# Patient Record
Sex: Male | Born: 2000 | Race: Black or African American | Hispanic: No | Marital: Single | Smoking: Never smoker
Health system: Southern US, Community
[De-identification: ages and names within clinical notes are randomized; demographics above are authoritative.]

## PROBLEM LIST (undated history)

## (undated) DIAGNOSIS — F419 Anxiety disorder, unspecified: Secondary | ICD-10-CM

---
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
## 2021-02-16 ENCOUNTER — Ambulatory Visit (HOSPITAL_COMMUNITY)
Admission: EM | Admit: 2021-02-16 | Discharge: 2021-02-16 | Disposition: A | Discharge status: Home / Self Care | Payer: Medicaid Other | Attending: Medical Oncology | Admitting: Medical Oncology

## 2021-02-16 ENCOUNTER — Encounter (HOSPITAL_COMMUNITY): Payer: Self-pay

## 2021-02-16 ENCOUNTER — Other Ambulatory Visit: Payer: Self-pay

## 2021-02-16 DIAGNOSIS — N50812 Left testicular pain: Secondary | ICD-10-CM | POA: Diagnosis not present

## 2021-02-16 DIAGNOSIS — N50811 Right testicular pain: Secondary | ICD-10-CM | POA: Diagnosis not present

## 2021-02-16 DIAGNOSIS — Z113 Encounter for screening for infections with a predominantly sexual mode of transmission: Secondary | ICD-10-CM | POA: Diagnosis not present

## 2021-02-16 LAB — HIV ANTIBODY (ROUTINE TESTING W REFLEX): HIV Screen 4th Generation wRfx: NONREACTIVE

## 2021-02-16 NOTE — ED Triage Notes (Signed)
Pt in for routine STD screening   Denies any sxs 

## 2021-02-16 NOTE — ED Provider Notes (Signed)
[facility]    CSN: [id number] Arrival date & time: 02/16/21  1801      History   Chief Complaint Chief Complaint  Patient presents with  . std screening    HPI Blake Elliott is a 20 y.o. male.   HPI   STI screening: Patient presents for STI screening.  He denies any known exposures, penile lesions, penile discharge, testicular pain, dysuria, fevers, night sweats or unintentional weight loss.   Past Medical History:  Diagnosis Date  . Anxiety     There are no problems to display for this patient.   History reviewed. No pertinent surgical history.    Home Medications    Prior to Admission medications   Medication Sig Start Date End Date Taking? Authorizing Provider  FLUoxetine (PROZAC) 20 MG tablet Take 1 tablet (20 mg total) by mouth daily. 09/04/19   Elvina Sidle, MD  FLUoxetine (PROZAC) 20 MG tablet Take 1 tablet (20 mg total) by mouth daily for 15 days. 09/20/20 10/05/20  Tanda Rockers, PA-C  omeprazole (PRILOSEC) 20 MG capsule Take 1 capsule (20 mg total) by mouth daily. 08/20/19 08/20/19  Elpidio Anis, PA-C  sucralfate (CARAFATE) 1 g tablet Take 1 tablet (1 g total) by mouth 4 (four) times daily -  with meals and at bedtime. 09/02/19 09/04/19  Mardella Layman, MD    Family History Family History  Problem Relation Age of Onset  . Asthma Mother   . Hypertension Father     Social History Social History   Tobacco Use  . Smoking status: Never Smoker  . Smokeless tobacco: Never Used  Vaping Use  . Vaping Use: Never used  Substance Use Topics  . Alcohol use: Never     Allergies   Patient has no known allergies.   Review of Systems Review of Systems  As stated above in HPI Physical Exam Triage Vital Signs ED Triage Vitals  Enc Vitals Group     BP 02/16/21 1856 130/85     Pulse Rate 02/16/21 1856 72     Resp 02/16/21 1856 17     Temp 02/16/21 1856 98.1 F (36.7 C)     Temp src --      SpO2 02/16/21 1856 98 %     Weight --       Height --      Head Circumference --      Peak Flow --      Pain Score 02/16/21 1855 2     Pain Loc --      Pain Edu? --      Excl. in [place]? --    No data found.  Updated Vital Signs BP 130/85   Pulse 72   Temp 98.1 F (36.7 C)   Resp 17   SpO2 98%   Physical Exam Vitals and nursing note reviewed. Exam conducted with a chaperone present (Adrianne).  Constitutional:      General: He is not in acute distress.    Appearance: He is not ill-appearing, toxic-appearing or diaphoretic.  Cardiovascular:     Rate and Rhythm: Normal rate and regular rhythm.     Heart sounds: Normal heart sounds.  Pulmonary:     Effort: Pulmonary effort is normal.     Breath sounds: Normal breath sounds.  Genitourinary:    Penis: Normal.      Testes: Normal.  Musculoskeletal:     Cervical back: Normal range of motion and neck supple.  Lymphadenopathy:     Cervical:  No cervical adenopathy.      UC Treatments / Results  Labs (all labs ordered are listed, but only abnormal results are displayed) Labs Reviewed - No data to display  EKG   Radiology No results found.  Procedures Procedures (including critical care time)  Medications Ordered in UC Medications - No data to display  Initial Impression / Assessment and Plan / UC Course  I have reviewed the triage vital signs and the nursing notes.  Pertinent labs & imaging results that were available during my care of the patient were reviewed by me and considered in my medical decision making (see chart for details).     New.  Examination is benign.  We did discuss red flag signs and symptoms that would indicate that he needs to be evaluated with an ultrasound at the hospital.  For now screening for STIs.  Safe sex practices handout handed to patient. Final Clinical Impressions(s) / UC Diagnoses   Final diagnoses:  None   Discharge Instructions   None    ED Prescriptions    None     PDMP not reviewed this encounter.    Rushie Chestnut, [place] 02/16/21 1937

## 2021-02-17 LAB — RPR: RPR Ser Ql: NONREACTIVE

## 2021-02-18 LAB — CYTOLOGY, (ORAL, ANAL, URETHRAL) ANCILLARY ONLY
Chlamydia: NEGATIVE
Comment: NEGATIVE
Comment: NEGATIVE
Comment: NORMAL
Neisseria Gonorrhea: NEGATIVE
Trichomonas: NEGATIVE

## 2021-06-06 ENCOUNTER — Other Ambulatory Visit: Payer: Self-pay

## 2021-06-06 ENCOUNTER — Emergency Department (HOSPITAL_BASED_OUTPATIENT_CLINIC_OR_DEPARTMENT_OTHER)
Admission: EM | Admit: 2021-06-06 | Discharge: 2021-06-06 | Disposition: A | Discharge status: Home / Self Care | Payer: Medicaid Other | Attending: Emergency Medicine | Admitting: Emergency Medicine

## 2021-06-06 ENCOUNTER — Encounter (HOSPITAL_BASED_OUTPATIENT_CLINIC_OR_DEPARTMENT_OTHER): Payer: Self-pay

## 2021-06-06 DIAGNOSIS — U071 COVID-19: Secondary | ICD-10-CM | POA: Diagnosis not present

## 2021-06-06 DIAGNOSIS — Z28310 Unvaccinated for covid-19: Secondary | ICD-10-CM | POA: Diagnosis not present

## 2021-06-06 DIAGNOSIS — R059 Cough, unspecified: Secondary | ICD-10-CM | POA: Diagnosis present

## 2021-06-06 LAB — RESP PANEL BY RT-PCR (FLU A&B, COVID) ARPGX2
Influenza A by PCR: NEGATIVE
Influenza B by PCR: NEGATIVE
SARS Coronavirus 2 by RT PCR: POSITIVE — AB

## 2021-06-06 LAB — GROUP A STREP BY PCR: Group A Strep by PCR: NOT DETECTED

## 2021-06-06 NOTE — ED Provider Notes (Signed)
[facility] EMERGENCY DEPARTMENT Provider Note   CSN: [id number] Arrival date & time: 06/06/21  1504     History Chief Complaint  Patient presents with   Cough    Blake Elliott is a 20 y.o. male.  20 year old otherwise healthy male presents with complaint of headache, body aches, fever, cough and sore throat.  Symptoms started about 5 days ago with back pain progressing to headache and body aches.  Patient has taken 3 at home COVID test which have all been negative.  Reports fevers at home with max temp of 103, has not had a fever for the past 2 days.  Patient is exposed to his mother who has tested positive for COVID.  Patient has not been vaccinated against COVID.  Blake Elliott was evaluated in Emergency Department on 06/06/2021 for the symptoms described in the history of present illness. He was evaluated in the context of the global COVID-19 pandemic, which necessitated consideration that the patient might be at risk for infection with the SARS-CoV-2 virus that causes COVID-19. Institutional protocols and algorithms that pertain to the evaluation of patients at risk for COVID-19 are in a state of rapid change based on information released by regulatory bodies including the CDC and federal and state organizations. These policies and algorithms were followed during the patient's care in the ED.       Past Medical History:  Diagnosis Date   Anxiety     There are no problems to display for this patient.   History reviewed. No pertinent surgical history.     Family History  Problem Relation Age of Onset   Asthma Mother    Hypertension Father     Social History   Tobacco Use   Smoking status: Never   Smokeless tobacco: Never  Vaping Use   Vaping Use: Never used  Substance Use Topics   Alcohol use: Never   Drug use: Never    Home Medications Prior to Admission medications   Medication Sig Start Date End Date Taking? Authorizing Provider  FLUoxetine  (PROZAC) 20 MG tablet Take 1 tablet (20 mg total) by mouth daily. 09/04/19   Elvina Sidle, MD  FLUoxetine (PROZAC) 20 MG tablet Take 1 tablet (20 mg total) by mouth daily for 15 days. 09/20/20 10/05/20  Tanda Rockers, PA-C  omeprazole (PRILOSEC) 20 MG capsule Take 1 capsule (20 mg total) by mouth daily. 08/20/19 08/20/19  Elpidio Anis, PA-C  sucralfate (CARAFATE) 1 g tablet Take 1 tablet (1 g total) by mouth 4 (four) times daily -  with meals and at bedtime. 09/02/19 09/04/19  Mardella Layman, MD    Allergies    Patient has no known allergies.  Review of Systems   Review of Systems  Constitutional:  Positive for chills and fever.  HENT:  Positive for sore throat. Negative for ear pain.   Respiratory:  Negative for shortness of breath.   Cardiovascular:  Negative for chest pain.  Gastrointestinal:  Negative for diarrhea, nausea and vomiting.  Genitourinary:  Negative for difficulty urinating.  Musculoskeletal:  Positive for arthralgias, back pain and myalgias. Negative for gait problem, neck pain and neck stiffness.  Skin:  Negative for rash and wound.  Allergic/Immunologic: Negative for immunocompromised state.  Neurological:  Positive for headaches. Negative for weakness.  Hematological:  Negative for adenopathy.  Psychiatric/Behavioral:  Negative for confusion.   All other systems reviewed and are negative.  Physical Exam Updated Vital Signs BP 121/76 (BP Location: Left Arm)   Pulse 82  Temp 98.5 F (36.9 C) (Oral)   Resp 18   Ht 5\' 8"  (1.727 m)   Wt 65.3 kg   SpO2 99%   BMI 21.90 kg/m   Physical Exam Vitals and nursing note reviewed.  Constitutional:      General: He is not in acute distress.    Appearance: He is well-developed. He is not diaphoretic.  HENT:     Head: Normocephalic and atraumatic.     Right Ear: Tympanic membrane and ear canal normal.     Left Ear: Tympanic membrane and ear canal normal.     Nose: Nose normal.     Mouth/Throat:     Mouth:  Mucous membranes are moist.     Pharynx: Posterior oropharyngeal erythema present. No oropharyngeal exudate.  Eyes:     Conjunctiva/sclera: Conjunctivae normal.  Cardiovascular:     Rate and Rhythm: Normal rate and regular rhythm.     Heart sounds: Normal heart sounds.  Pulmonary:     Effort: Pulmonary effort is normal.     Breath sounds: Normal breath sounds.  Musculoskeletal:     Cervical back: Neck supple. No rigidity.  Lymphadenopathy:     Cervical: Cervical adenopathy present.  Skin:    General: Skin is warm and dry.     Findings: No erythema or rash.  Neurological:     Mental Status: He is alert and oriented to person, place, and time.     Motor: No weakness.     Gait: Gait normal.  Psychiatric:        Behavior: Behavior normal.    ED Results / Procedures / Treatments   Labs (all labs ordered are listed, but only abnormal results are displayed) Labs Reviewed  RESP PANEL BY RT-PCR (FLU A&B, COVID) ARPGX2 - Abnormal; Notable for the following components:      Result Value   SARS Coronavirus 2 by RT PCR POSITIVE (*)    All other components within normal limits  GROUP A STREP BY PCR    EKG None  Radiology No results found.  Procedures Procedures   Medications Ordered in ED Medications - No data to display  ED Course  I have reviewed the triage vital signs and the nursing notes.  Pertinent labs & imaging results that were available during my care of the patient were reviewed by me and considered in my medical decision making (see chart for details).  Clinical Course as of 06/06/21 1757  Wed Jun 06, 2021  6070 20 year old male with cough, body aches, sore throat as above.  On exam found to have oropharyngeal erythema, lungs are clear.  Vitals reassuring with O2 sat of 99% on room air.  Patient is negative for strep, positive for COVID.  Recommend symptom management at home and recheck with PCP as needed. [LM]    Clinical Course User Index [LM] 12   MDM Rules/Calculators/A&P                            Final Clinical Impression(s) / ED Diagnoses Final diagnoses:  COVID    Rx / DC Orders ED Discharge Orders     None        Alden Hipp 06/06/21 1757    06/08/21, MD 06/06/21 [phone]

## 2021-06-06 NOTE — ED Triage Notes (Signed)
Pt c/o flu like sx x 5 days-NAD-steady gait 

## 2021-06-06 NOTE — Discharge Instructions (Signed)
Your COVID test is positive.  Finish out your 10-day quarantine from the day of symptom onset.  Take Motrin and Tylenol as needed as directed for body aches, sore throat, headache, fever.  Recheck with your primary care provider as needed.

## 2021-06-07 ENCOUNTER — Telehealth: Payer: Self-pay

## 2021-06-07 NOTE — Telephone Encounter (Signed)
Transition Care Management Unsuccessful Follow-up Telephone Call  Date of discharge and from where:  06/06/2021-[facility]   Attempts:  1st Attempt  Reason for unsuccessful TCM follow-up call:  Unable to reach patient

## 2021-06-08 NOTE — Telephone Encounter (Signed)
Transition Care Management Unsuccessful Follow-up Telephone Call  Date of discharge and from where:  06/06/2021-[facility]   Attempts:  2nd Attempt  Reason for unsuccessful TCM follow-up call:  Unable to reach patient

## 2021-06-11 NOTE — Telephone Encounter (Signed)
Transition Care Management Unsuccessful Follow-up Telephone Call  Date of discharge and from where:  06/06/2021 from [facility]  Attempts:  3rd Attempt  Reason for unsuccessful TCM follow-up call:  Unable to reach patient    

## 2022-01-19 ENCOUNTER — Other Ambulatory Visit: Payer: Self-pay

## 2022-01-19 ENCOUNTER — Encounter (HOSPITAL_BASED_OUTPATIENT_CLINIC_OR_DEPARTMENT_OTHER): Payer: Self-pay | Admitting: Emergency Medicine

## 2022-01-19 ENCOUNTER — Emergency Department (HOSPITAL_BASED_OUTPATIENT_CLINIC_OR_DEPARTMENT_OTHER)
Admission: EM | Admit: 2022-01-19 | Discharge: 2022-01-19 | Disposition: A | Discharge status: Home / Self Care | Payer: Medicaid Other | Attending: Emergency Medicine | Admitting: Emergency Medicine

## 2022-01-19 DIAGNOSIS — Z113 Encounter for screening for infections with a predominantly sexual mode of transmission: Secondary | ICD-10-CM

## 2022-01-19 DIAGNOSIS — A64 Unspecified sexually transmitted disease: Secondary | ICD-10-CM | POA: Diagnosis present

## 2022-01-19 LAB — URINALYSIS, ROUTINE W REFLEX MICROSCOPIC
Bilirubin Urine: NEGATIVE
Glucose, UA: NEGATIVE mg/dL
Hgb urine dipstick: NEGATIVE
Ketones, ur: NEGATIVE mg/dL
Leukocytes,Ua: NEGATIVE
Nitrite: NEGATIVE
Protein, ur: NEGATIVE mg/dL
Specific Gravity, Urine: 1.025 (ref 1.005–1.030)
pH: 6.5 (ref 5.0–8.0)

## 2022-01-19 LAB — HIV ANTIBODY (ROUTINE TESTING W REFLEX): HIV Screen 4th Generation wRfx: NONREACTIVE

## 2022-01-19 NOTE — ED Provider Notes (Signed)
?[facility] EMERGENCY DEPARTMENT ?Provider Note ? ? ?CSN: [id number] ?Arrival date & time: 01/19/22  1715 ? ?  ? ?History ?Chief Complaint  ?Patient presents with  ? STD Testing  ? ? ?Blake Elliott is a 21 y.o. male otherwise healthy presenting to the ED for STD testing.  Reports he does not have any symptoms nor does he have any known positive contacts.  He denies any penile, testicular, or scrotal swelling or pain.  Denies any penile drip.  Denies any abdominal pain, nausea, vomiting.  Denies any or hematuria.  Denies any rashes.  No surgical history.  Denies any allergies.  ? ?HPI ? ?  ? ?Home Medications ?Prior to Admission medications   ?Medication Sig Start Date End Date Taking? Authorizing Provider  ?FLUoxetine (PROZAC) 20 MG tablet Take 1 tablet (20 mg total) by mouth daily. 09/04/19   Elvina Sidle, MD  ?FLUoxetine (PROZAC) 20 MG tablet Take 1 tablet (20 mg total) by mouth daily for 15 days. 09/20/20 10/05/20  Tanda Rockers, PA-C  ?omeprazole (PRILOSEC) 20 MG capsule Take 1 capsule (20 mg total) by mouth daily. 08/20/19 08/20/19  Elpidio Anis, PA-C  ?sucralfate (CARAFATE) 1 g tablet Take 1 tablet (1 g total) by mouth 4 (four) times daily -  with meals and at bedtime. 09/02/19 09/04/19  Mardella Layman, MD  ?   ? ?Allergies    ?Patient has no known allergies.   ? ?Review of Systems   ?Review of Systems  ?All other systems reviewed and are negative. ? ?Physical Exam ?Updated Vital Signs ?BP 114/71   Pulse 72   Temp 98.2 ?F (36.8 ?C) (Oral)   Resp 17   Ht 5\' 8"  (1.727 m)   Wt 61.2 kg   SpO2 100%   BMI 20.53 kg/m?  ?Physical Exam ?Vitals and nursing note reviewed.  ?Constitutional:   ?   General: He is not in acute distress. ?   Appearance: Normal appearance. He is not toxic-appearing.  ?Eyes:  ?   General: No scleral icterus. ?Pulmonary:  ?   Effort: Pulmonary effort is normal. No respiratory distress.  ?Abdominal:  ?   General: Abdomen is flat.  ?Skin: ?   General: Skin is dry.  ?   Findings:  No rash.  ?Neurological:  ?   General: No focal deficit present.  ?   Mental Status: He is alert. Mental status is at baseline.  ?Psychiatric:     ?   Mood and Affect: Mood normal.  ? ? ?ED Results / Procedures / Treatments   ?Labs ?(all labs ordered are listed, but only abnormal results are displayed) ?Labs Reviewed  ?URINALYSIS, ROUTINE W REFLEX MICROSCOPIC  ?RPR  ?HIV ANTIBODY (ROUTINE TESTING W REFLEX)  ?GC/CHLAMYDIA PROBE AMP ([place]) NOT AT [facility]  ? ? ?EKG ?None ? ?Radiology ?No results found. ? ?Procedures ?Procedures  ? ? ?Medications Ordered in ED ?Medications - No data to display ? ?ED Course/ Medical Decision Making/ A&P ?  ?                        ?Medical Decision Making ?Amount and/or Complexity of Data Reviewed ?Labs: ordered. ? ? ?21 year old male presents emergency department for evaluation of STD testing.  Vital signs are stable.  Physical exam unremarkable.  GU exam deferred as patient is asymptomatic.  Patient is currently asymptomatic and denies any known positive contacts.  Discussed with the patient prophylactically treatment for gonorrhea, chlamydia, and trichomonas.  Patient would like to defer prophylactic treatment at this time and wait for his results come back.  Urinalysis performed testing for gonorrhea, chlamydia, syphilis, and HIV.  Labs will not result today.  Instructed the patient to check on his MyChart for results.  Discussed with him that someone will call him if one of these is positive and we will call him in treatment.  In the meantime, he will need to abstain from sexual intercourse as he can get infected in this time period.  Recommended follow-up with PCP if he were to have symptoms in the meantime.  Patient agrees with plan.  Patient stable being discharged home in good condition. ? ? ?Final Clinical Impression(s) / ED Diagnoses ?Final diagnoses:  ?Screen for STD (sexually transmitted disease)  ? ? ?Rx / DC Orders ?ED Discharge Orders   ? ? None  ? ?  ? ? ?  ?Achille Rich, PA-C ?01/19/22 1953 ? ?  ?Melene Plan, DO ?01/19/22 2155 ? ?

## 2022-01-19 NOTE — ED Triage Notes (Signed)
Pt arrives pov with request for STD check. Pt denies known exposure, denies symptoms.  ?

## 2022-01-19 NOTE — Discharge Instructions (Signed)
You were seen in the ER today for STD testing. We have tested you for Gonorrhea, chlamydia, HIV, and syphillis. If you have any symptoms, please return to your PCP for further testing. If you test positive, someone will call you and prescribe treatment. Please check your MyChart in a few days for results. Until you receive your results, abstain from sexual intercourse as you may become infected in the meantime.  ?

## 2022-01-20 LAB — RPR: RPR Ser Ql: NONREACTIVE

## 2022-01-21 LAB — GC/CHLAMYDIA PROBE AMP (~~LOC~~) NOT AT ARMC
Chlamydia: NEGATIVE
Comment: NEGATIVE
Comment: NORMAL
Neisseria Gonorrhea: NEGATIVE

## 2022-04-13 ENCOUNTER — Encounter (HOSPITAL_COMMUNITY): Payer: Self-pay

## 2022-04-13 ENCOUNTER — Emergency Department (HOSPITAL_COMMUNITY)
Admission: EM | Admit: 2022-04-13 | Discharge: 2022-04-13 | Disposition: A | Discharge status: Home / Self Care | Payer: Medicaid Other | Attending: Emergency Medicine | Admitting: Emergency Medicine

## 2022-04-13 ENCOUNTER — Other Ambulatory Visit: Payer: Self-pay

## 2022-04-13 DIAGNOSIS — R55 Syncope and collapse: Secondary | ICD-10-CM | POA: Insufficient documentation

## 2022-04-13 DIAGNOSIS — S01511A Laceration without foreign body of lip, initial encounter: Secondary | ICD-10-CM | POA: Insufficient documentation

## 2022-04-13 DIAGNOSIS — X58XXXA Exposure to other specified factors, initial encounter: Secondary | ICD-10-CM | POA: Insufficient documentation

## 2022-04-13 DIAGNOSIS — S00501A Unspecified superficial injury of lip, initial encounter: Secondary | ICD-10-CM | POA: Diagnosis present

## 2022-04-13 LAB — CBC WITH DIFFERENTIAL/PLATELET
Abs Immature Granulocytes: 0.02 10*3/uL (ref 0.00–0.07)
Basophils Absolute: 0 10*3/uL (ref 0.0–0.1)
Basophils Relative: 1 %
Eosinophils Absolute: 0 10*3/uL (ref 0.0–0.5)
Eosinophils Relative: 0 %
HCT: 51.8 % (ref 39.0–52.0)
Hemoglobin: 17 g/dL (ref 13.0–17.0)
Immature Granulocytes: 0 %
Lymphocytes Relative: 22 %
Lymphs Abs: 1.5 10*3/uL (ref 0.7–4.0)
MCH: 28.4 pg (ref 26.0–34.0)
MCHC: 32.8 g/dL (ref 30.0–36.0)
MCV: 86.5 fL (ref 80.0–100.0)
Monocytes Absolute: 0.6 10*3/uL (ref 0.1–1.0)
Monocytes Relative: 10 %
Neutro Abs: 4.4 10*3/uL (ref 1.7–7.7)
Neutrophils Relative %: 67 %
Platelets: 261 10*3/uL (ref 150–400)
RBC: 5.99 MIL/uL — ABNORMAL HIGH (ref 4.22–5.81)
RDW: 13.1 % (ref 11.5–15.5)
WBC: 6.6 10*3/uL (ref 4.0–10.5)
nRBC: 0 % (ref 0.0–0.2)

## 2022-04-13 LAB — BASIC METABOLIC PANEL
Anion gap: 14 (ref 5–15)
BUN: 17 mg/dL (ref 6–20)
CO2: 23 mmol/L (ref 22–32)
Calcium: 9.7 mg/dL (ref 8.9–10.3)
Chloride: 100 mmol/L (ref 98–111)
Creatinine, Ser: 0.84 mg/dL (ref 0.61–1.24)
GFR, Estimated: >60 mL/min (ref >60)
Glucose, Bld: 99 mg/dL (ref 70–99)
Potassium: 3.8 mmol/L (ref 3.5–5.1)
Sodium: 137 mmol/L (ref 135–145)

## 2022-04-13 MED ORDER — LIDOCAINE-EPINEPHRINE (PF) 2 %-1:200000 IJ SOLN
10.0000 mL | Freq: Once | INTRAMUSCULAR | Status: AC
  Administered 2022-04-13: 10 mL
  Filled 2022-04-13: qty 20

## 2022-04-13 NOTE — ED Triage Notes (Signed)
Pt has a top right lip laceration from falling at midnight.

## 2022-04-13 NOTE — Discharge Instructions (Addendum)
You came to the emergency department today to be evaluated for your syncopal episode and lip laceration.  Your EKG and lab work were reassuring.  Your laceration required stiches.  Please follow up with your primary care provider, urgent care or return to the emergency department in 7 days to have your stitches removed.    Please keep the wound dry for the next 24 hours.  After that you may gently wash the area with soap and water.  Do not submerge the wound under water until the stiches are removed.    Get help right away if: You faint. You hit your head or are injured after fainting. You have any of these symptoms that may indicate trouble with your heart: Fast or irregular heartbeats (palpitations). Unusual pain in your chest, abdomen, or back. Shortness of breath. You have a seizure. You have a severe headache. You are confused. You have vision problems. You have severe weakness or trouble walking. You are bleeding from your mouth or rectum, or you have black or tarry stool.

## 2022-04-13 NOTE — ED Provider Notes (Signed)
[facility] DEPT Provider Note   CSN: [id number] Arrival date & time: 04/13/22  0203     History  Chief Complaint  Patient presents with   Lip Laceration    Blake Elliott is a 21 y.o. male with no pertinent past medical history.  Presents emerged department complaint of syncopal episode and lip laceration.  Patient states that today at approximately midnight he was smoking hookah when he began to feel lightheaded.  Patient stood and walked to the bathroom when he had a syncopal episode.  Patient believes single episode only lasted a few seconds.  Fall occurred at ground-level.  Patient had no preceding chest pain, shortness of breath, or sudden onset of headache.  Patient noted laceration after his syncopal episode and fall.  Patient has pain to laceration site.  Patient denies any neck pain, back pain, numbness, weakness, visual disturbance, saddle anesthesia, urinary retention, dental pain, headache, chest pain, shortness of breath.      HPI     Home Medications Prior to Admission medications   Medication Sig Start Date End Date Taking? Authorizing Provider  FLUoxetine (PROZAC) 20 MG tablet Take 1 tablet (20 mg total) by mouth daily. 09/04/19   Robyn Haber, MD  FLUoxetine (PROZAC) 20 MG tablet Take 1 tablet (20 mg total) by mouth daily for 15 days. 09/20/20 10/05/20  Eustaquio Maize, PA-C  omeprazole (PRILOSEC) 20 MG capsule Take 1 capsule (20 mg total) by mouth daily. 08/20/19 08/20/19  Charlann Lange, PA-C  sucralfate (CARAFATE) 1 g tablet Take 1 tablet (1 g total) by mouth 4 (four) times daily -  with meals and at bedtime. 09/02/19 09/04/19  Vanessa Kick, MD      Allergies    Patient has no known allergies.    Review of Systems   Review of Systems  Constitutional:  Negative for chills and fever.  HENT:  Negative for dental problem and facial swelling.   Eyes:  Negative for visual disturbance.  Respiratory:  Negative for shortness of  breath.   Cardiovascular:  Negative for chest pain.  Gastrointestinal:  Negative for abdominal pain, nausea and vomiting.  Genitourinary:  Negative for difficulty urinating.  Musculoskeletal:  Negative for back pain and neck pain.  Skin:  Positive for wound. Negative for color change, pallor and rash.  Neurological:  Positive for syncope and light-headedness. Negative for dizziness, tremors, seizures, facial asymmetry, speech difficulty, weakness, numbness and headaches.  Psychiatric/Behavioral:  Negative for confusion.    Physical Exam Updated Vital Signs BP 112/74 (BP Location: Left Arm)   Pulse 89   Temp 98.1 F (36.7 C) (Oral)   Resp 16   SpO2 98%  Physical Exam Vitals and nursing note reviewed.  Constitutional:      General: He is not in acute distress.    Appearance: He is not ill-appearing, toxic-appearing or diaphoretic.  HENT:     Head: Normocephalic. Laceration present. No raccoon eyes, Battle's sign, abrasion, contusion, masses, right periorbital erythema or left periorbital erythema.     Comments: 1 cm laceration to right upper lip.  Wound edges are jagged.  Wound is partial-thickness.  Wound does extend through vermilion border.  Hemorrhage controlled.  No foreign bodies noted. Eyes:     General: No scleral icterus.       Right eye: No discharge.        Left eye: No discharge.     Extraocular Movements: Extraocular movements intact.     Conjunctiva/sclera: Conjunctivae normal.  Pupils: Pupils are equal, round, and reactive to light.  Cardiovascular:     Rate and Rhythm: Normal rate.     Pulses:          Radial pulses are 2+ on the right side and 2+ on the left side.     Heart sounds: Normal heart sounds, S1 normal and S2 normal. Heart sounds not distant. No murmur heard. Pulmonary:     Effort: Pulmonary effort is normal. No tachypnea, bradypnea or respiratory distress.     Breath sounds: Normal breath sounds. No stridor.  Abdominal:     General: Abdomen is  flat. There is no distension. There are no signs of injury.     Palpations: Abdomen is soft. There is no mass or pulsatile mass.     Tenderness: There is no abdominal tenderness. There is no guarding or rebound.  Musculoskeletal:     Cervical back: Normal, normal range of motion and neck supple.     Thoracic back: No swelling, edema, deformity, signs of trauma, lacerations, spasms, tenderness or bony tenderness.     Lumbar back: No swelling, edema, deformity, signs of trauma, lacerations, spasms, tenderness or bony tenderness.     Comments: No midline tenderness or deformity to cervical, thoracic, lumbar spine.  No tenderness, bony tenderness, or deformity to bilateral upper or lower extremity.  Patient has full active range of motion to bilateral upper and lower extremities without difficulty or complaints of pain.  Skin:    General: Skin is warm and dry.  Neurological:     General: No focal deficit present.     Mental Status: He is alert.     GCS: GCS eye subscore is 4. GCS verbal subscore is 5. GCS motor subscore is 6.     Cranial Nerves: Cranial nerves 2-12 are intact. No cranial nerve deficit, dysarthria or facial asymmetry.  Psychiatric:        Behavior: Behavior is cooperative.    ED Results / Procedures / Treatments   Labs (all labs ordered are listed, but only abnormal results are displayed) Labs Reviewed  CBC WITH DIFFERENTIAL/PLATELET  BASIC METABOLIC PANEL    EKG None  Radiology No results found.  Procedures .Marland KitchenLaceration Repair  Date/Time: 04/13/2022 4:14 AM Performed by: Loni Beckwith, PA-C Authorized by: Loni Beckwith, PA-C   Consent:    Consent obtained:  Verbal   Consent given by:  Patient   Risks discussed:  Infection, need for additional repair, pain, poor cosmetic result and poor wound healing   Alternatives discussed:  No treatment and delayed treatment Universal protocol:    Procedure explained and questions answered to patient or  proxy's satisfaction: yes     Relevant documents present and verified: yes     Test results available: yes     Required blood products, implants, devices, and special equipment available: yes     Immediately prior to procedure, a time out was called: yes     Patient identity confirmed:  Verbally with patient and arm band Anesthesia:    Anesthesia method:  Local infiltration   Local anesthetic:  Lidocaine 2% WITH epi Laceration details:    Location:  Lip   Lip location:  Upper exterior lip   Length (cm):  1 Pre-procedure details:    Preparation:  Patient was prepped and draped in usual sterile fashion Exploration:    Wound exploration: entire depth of wound visualized     Wound extent: no foreign bodies/material noted   Treatment:  Amount of cleaning:  Extensive   Irrigation solution:  Sterile saline   Irrigation method:  Syringe Skin repair:    Repair method:  Sutures   Suture size:  6-0   Suture material:  Prolene   Suture technique:  Simple interrupted   Number of sutures:  3 Approximation:    Approximation:  Close Repair type:    Repair type:  Complex Post-procedure details:    Procedure completion:  Tolerated well, no immediate complications    Medications Ordered in ED Medications  lidocaine-EPINEPHrine (XYLOCAINE W/EPI) 2 %-1:200000 (PF) injection 10 mL (has no administration in time range)    ED Course/ Medical Decision Making/ A&P                           Medical Decision Making Amount and/or Complexity of Data Reviewed Labs: ordered.  Risk Prescription drug management.   Alert 21 year old male no acute distress, nontoxic-appearing.  Presents the ED with a chief complaint of syncopal episode and lip laceration.  Information obtained from patient and patient's cousin at bedside.  I reviewed patient's past medical records including previous provider notes, labs, and imaging.  Due to reports of syncopal episode will obtain EKG and basic lab work,  however I suspect that syncope was secondary to nicotine use from smoking hookah.  Patient's laceration will require sutures.  Patient is unsure when his last tetanus shot was.  I offered patient tetanus shot however he refuses at this time, he was informed that if he contracts tetanus he could have life-threatening complications including but not limited to death.  Patient is aware and continues to refuse tetanus shot.  I personally viewed and interpreted patient's EKG.  Tracing shows sinus arrhythmia.  I personally viewed interpret patient's lab results.  Pertinent findings include: -CBC and BMP unremarkable  Patient is able to stand and ambulate without difficulty.  No reproduction of lightheadedness or syncopal episode.  Suture procedure as noted above.  3 sutures were placed, patient will need to have these removed in 7 days.          Final Clinical Impression(s) / ED Diagnoses Final diagnoses:  None    Rx / DC Orders ED Discharge Orders     None         Loni Beckwith, PA-C 04/13/22 0415    Maudie Flakes, MD 04/13/22 [phone]
